# Patient Record
Sex: Male | Born: 2000 | Race: White | Hispanic: No | Marital: Single | Smoking: Never smoker
Health system: Southern US, Community
[De-identification: ages and names within clinical notes are randomized; demographics above are authoritative.]

---
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
## 2018-10-28 ENCOUNTER — Other Ambulatory Visit: Payer: Self-pay

## 2018-10-28 ENCOUNTER — Emergency Department
Admission: EM | Admit: 2018-10-28 | Discharge: 2018-10-28 | Disposition: A | Discharge status: Home / Self Care | Payer: Worker's Compensation | Attending: Student in an Organized Health Care Education/Training Program | Admitting: Student in an Organized Health Care Education/Training Program

## 2018-10-28 ENCOUNTER — Encounter: Payer: Self-pay | Admitting: Emergency Medicine

## 2018-10-28 DIAGNOSIS — Y9389 Activity, other specified: Secondary | ICD-10-CM | POA: Insufficient documentation

## 2018-10-28 DIAGNOSIS — M7918 Myalgia, other site: Secondary | ICD-10-CM | POA: Diagnosis not present

## 2018-10-28 DIAGNOSIS — Y92410 Unspecified street and highway as the place of occurrence of the external cause: Secondary | ICD-10-CM | POA: Diagnosis not present

## 2018-10-28 DIAGNOSIS — Z0279 Encounter for issue of other medical certificate: Secondary | ICD-10-CM | POA: Insufficient documentation

## 2018-10-28 DIAGNOSIS — M5489 Other dorsalgia: Secondary | ICD-10-CM | POA: Diagnosis not present

## 2018-10-28 DIAGNOSIS — Y990 Civilian activity done for income or pay: Secondary | ICD-10-CM | POA: Diagnosis not present

## 2018-10-28 NOTE — Discharge Instructions (Addendum)
Take 2 Extra Strength Tylenol or 600mg  of ibuprofen for pain.  If you develop pain that is not well controlled with tylenol or ibuprofen, see your primary care provider or return to the ER.
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
## 2022-03-14 ENCOUNTER — Other Ambulatory Visit: Payer: Self-pay | Admitting: Physician Assistant

## 2022-03-14 DIAGNOSIS — M25561 Pain in right knee: Secondary | ICD-10-CM

## 2022-03-18 ENCOUNTER — Encounter: Payer: Self-pay | Admitting: Physician Assistant

## 2022-03-18 ENCOUNTER — Other Ambulatory Visit: Payer: Self-pay | Admitting: Physician Assistant

## 2022-03-18 DIAGNOSIS — M25561 Pain in right knee: Secondary | ICD-10-CM
